# Patient Record
Sex: Male | Born: 1975
Health system: Southern US, Community
[De-identification: ages and names within clinical notes are randomized; demographics above are authoritative.]

## PROBLEM LIST (undated history)

## (undated) DIAGNOSIS — T7840XA Allergy, unspecified, initial encounter: Secondary | ICD-10-CM

## (undated) HISTORY — DX: Allergy, unspecified, initial encounter: T78.40XA

---

## 2002-11-23 HISTORY — PX: ANTERIOR CRUCIATE LIGAMENT REPAIR: SHX115

## 2007-06-15 ENCOUNTER — Ambulatory Visit (HOSPITAL_COMMUNITY): Admission: RE | Admit: 2007-06-15 | Discharge: 2007-06-15 | Payer: Self-pay | Admitting: Pulmonary Disease

## 2013-01-16 ENCOUNTER — Other Ambulatory Visit: Payer: Self-pay | Admitting: Emergency Medicine

## 2013-01-16 NOTE — Telephone Encounter (Signed)
Please pull paper chart. Patient not seen in epic.  

## 2013-01-17 NOTE — Telephone Encounter (Signed)
Chart pulled at pa pool [id number]

## 2013-01-25 ENCOUNTER — Other Ambulatory Visit: Payer: Self-pay | Admitting: Emergency Medicine

## 2013-01-25 NOTE — Telephone Encounter (Signed)
PT SAID PHARMACY SENT OVER A REQUEST FOR A REFILL ON HIS INHALER A WEEK AGO.  THEY HAVE NOT HEARD FROM [place] [phone]

## 2013-01-31 NOTE — Telephone Encounter (Signed)
Pt will make appt to be seen.  He has not been seen since 03/2011

## 2013-02-10 ENCOUNTER — Ambulatory Visit (INDEPENDENT_AMBULATORY_CARE_PROVIDER_SITE_OTHER): Payer: 59 | Admitting: Emergency Medicine

## 2013-02-10 VITALS — BP 138/96 | HR 86 | Temp 98.0°F | Resp 16 | Ht 74.0 in | Wt 238.0 lb

## 2013-02-10 DIAGNOSIS — J309 Allergic rhinitis, unspecified: Secondary | ICD-10-CM

## 2013-02-10 DIAGNOSIS — Z0000 Encounter for general adult medical examination without abnormal findings: Secondary | ICD-10-CM

## 2013-02-10 LAB — LIPID PANEL
Cholesterol: 174 mg/dL (ref 0–200)
HDL: 34 mg/dL — ABNORMAL LOW (ref >39)
Total CHOL/HDL Ratio: 5.1 Ratio
Triglycerides: 158 mg/dL — ABNORMAL HIGH (ref <150)
VLDL: 32 mg/dL (ref 0–40)

## 2013-02-10 LAB — POCT UA - MICROSCOPIC ONLY
Crystals, Ur, HPF, POC: NEGATIVE
Epithelial cells, urine per micros: NEGATIVE
Mucus, UA: NEGATIVE
Yeast, UA: NEGATIVE

## 2013-02-10 LAB — POCT CBC
HCT, POC: 48.4 % (ref 43.5–53.7)
Lymph, poc: 2.4 (ref 0.6–3.4)
MCHC: 32 g/dL (ref 31.8–35.4)
POC Granulocyte: 3.5 (ref 2–6.9)
POC LYMPH PERCENT: 37.7 %L (ref 10–50)
RDW, POC: 15 %

## 2013-02-10 LAB — POCT URINALYSIS DIPSTICK
Blood, UA: NEGATIVE
Ketones, UA: NEGATIVE
Leukocytes, UA: NEGATIVE
Spec Grav, UA: >=1.03
pH, UA: 5.5

## 2013-02-10 LAB — COMPREHENSIVE METABOLIC PANEL
BUN: 16 mg/dL (ref 6–23)
CO2: 31 mEq/L (ref 19–32)
Creat: 0.9 mg/dL (ref 0.50–1.35)
Glucose, Bld: 87 mg/dL (ref 70–99)
Total Bilirubin: 0.8 mg/dL (ref 0.3–1.2)

## 2013-02-10 MED ORDER — MOMETASONE FUROATE 50 MCG/ACT NA SUSP: 4.0000 | Freq: Every day | NASAL | Status: DC

## 2013-02-10 NOTE — Progress Notes (Signed)
[facility] [address] [place] [address] [phone]  Date:  02/10/2013   Name:  Lee Gross   DOB:  Dec 27, 1975   MRN:  [id number]  PCP:  No primary provider on file.    Chief Complaint: Medication Refill   History of Present Illness:  Lee Gross is a 37 y.o. very pleasant male patient who presents with the following:  For complete annual physical. History of seasonal allergic rhinitis.  Works as Company secretary.   Current immunizations.  No chronic medications.  Denies other complaint or health concern today.  Nonsmoker    There is no problem list on file for this patient.   No past medical history on file.  No past surgical history on file.  History  Substance Use Topics  . Smoking status: Never Smoker   . Smokeless tobacco: Not on file  . Alcohol Use: No    No family history on file.  No Known Allergies  Medication list has been reviewed and updated.  No current outpatient prescriptions on file prior to visit.   No current facility-administered medications on file prior to visit.    Review of Systems:  As per HPI, otherwise negative.    Physical Examination: Filed Vitals:   02/10/13 1108  BP: 138/96  Pulse: 86  Temp: 98 F (36.7 C)  Resp: 16   Filed Vitals:   02/10/13 1108  Height: 6\' 2"  (1.88 m)  Weight: 238 lb (107.956 kg)   Body mass index is 30.54 kg/(m^2). Ideal Body Weight: Weight in (lb) to have BMI = 25: 194.3  GEN: WDWN, NAD, Non-toxic, A & O x 3 HEENT: Atraumatic, Normocephalic. Neck supple. No masses, No LAD. Ears and Nose: No external deformity. CV: RRR, No M/G/R. No JVD. No thrill. No extra heart sounds. PULM: CTA B, no wheezes, crackles, rhonchi. No retractions. No resp. distress. No accessory muscle use. ABD: S, NT, ND, +BS. No rebound. No HSM. EXTR: No c/c/e NEURO Normal gait.  PSYCH: Normally interactive. Conversant. Not depressed or anxious appearing.  Calm demeanor.  RECTAL:  NIBMH at  age 5  Assessment and Plan: Wellness exam LABS Follow up based on labs   Signed,  Phillips Odor, MD

## 2013-02-10 NOTE — Patient Instructions (Addendum)
Allergic Rhinitis  Allergic rhinitis is when the mucous membranes in the nose respond to allergens. Allergens are particles in the air that cause your body to have an allergic reaction. This causes you to release allergic antibodies. Through a chain of events, these eventually cause you to release histamine into the blood stream (hence the use of antihistamines). Although meant to be protective to the body, it is this release that causes your discomfort, such as frequent sneezing, congestion and an itchy runny nose.    CAUSES    The pollen allergens may come from grasses, trees, and weeds. This is seasonal allergic rhinitis, or "hay fever." Other allergens cause year-round allergic rhinitis (perennial allergic rhinitis) such as house dust mite allergen, pet dander and mold spores.    SYMPTOMS     Nasal stuffiness (congestion).   Runny, itchy nose with sneezing and tearing of the eyes.   There is often an itching of the mouth, eyes and ears.  It cannot be cured, but it can be controlled with medications.  DIAGNOSIS    If you are unable to determine the offending allergen, skin or blood testing may find it.  TREATMENT     Avoid the allergen.   Medications and allergy shots (immunotherapy) can help.   Hay fever may often be treated with antihistamines in pill or nasal spray forms. Antihistamines block the effects of histamine. There are over-the-counter medicines that may help with nasal congestion and swelling around the eyes. Check with your caregiver before taking or giving this medicine.  If the treatment above does not work, there are many new medications your caregiver can prescribe. Stronger medications may be used if initial measures are ineffective. Desensitizing injections can be used if medications and avoidance fails. Desensitization is when a patient is given ongoing shots until the body becomes less sensitive to the allergen. Make sure you follow up with your caregiver if problems continue.   SEEK MEDICAL CARE IF:     You develop fever (more than 100.5 F (38.1 C).   You develop a cough that does not stop easily (persistent).   You have shortness of breath.   You start wheezing.   Symptoms interfere with normal daily activities.  Document Released: 08/04/2001 Document Revised: 02/01/2012 Document Reviewed: 02/13/2009  Exit[employer] Patient Information 2013 [employer].

## 2013-02-16 ENCOUNTER — Other Ambulatory Visit: Payer: Self-pay

## 2013-02-16 ENCOUNTER — Other Ambulatory Visit: Payer: Self-pay | Admitting: Emergency Medicine

## 2013-02-16 NOTE — Telephone Encounter (Signed)
Pt needs another allergy prescription due to last one given too expensive. Please call into [facility] and  Let pt know when done. [phone]

## 2013-02-17 MED ORDER — FLUTICASONE PROPIONATE 50 MCG/ACT NA SUSP: 2.0000 | Freq: Every day | NASAL | Status: DC

## 2013-02-17 NOTE — Progress Notes (Signed)
Reviewed and agree.

## 2013-02-17 NOTE — Telephone Encounter (Signed)
Nasonex expensive, wants something cheaper. Pended generic flonase please advise

## 2014-08-25 ENCOUNTER — Ambulatory Visit (INDEPENDENT_AMBULATORY_CARE_PROVIDER_SITE_OTHER): Payer: 59 | Admitting: Emergency Medicine

## 2014-08-25 VITALS — BP 131/86 | HR 54 | Temp 97.8°F | Resp 20 | Ht 74.4 in | Wt 231.0 lb

## 2014-08-25 DIAGNOSIS — Z131 Encounter for screening for diabetes mellitus: Secondary | ICD-10-CM

## 2014-08-25 DIAGNOSIS — K429 Umbilical hernia without obstruction or gangrene: Secondary | ICD-10-CM

## 2014-08-25 DIAGNOSIS — R109 Unspecified abdominal pain: Secondary | ICD-10-CM

## 2014-08-25 DIAGNOSIS — Z13220 Encounter for screening for lipoid disorders: Secondary | ICD-10-CM

## 2014-08-25 DIAGNOSIS — M722 Plantar fascial fibromatosis: Secondary | ICD-10-CM

## 2014-08-25 DIAGNOSIS — Z0000 Encounter for general adult medical examination without abnormal findings: Secondary | ICD-10-CM

## 2014-08-25 LAB — GLUCOSE, POCT (MANUAL RESULT ENTRY): POC GLUCOSE: 84 mg/dL (ref 70–99)

## 2014-08-25 LAB — COMPREHENSIVE METABOLIC PANEL
ALT: 16 U/L (ref 0–53)
AST: 23 U/L (ref 0–37)
Albumin: 4.6 g/dL (ref 3.5–5.2)
Alkaline Phosphatase: 106 U/L (ref 39–117)
BUN: 14 mg/dL (ref 6–23)
CALCIUM: 9.5 mg/dL (ref 8.4–10.5)
CHLORIDE: 101 meq/L (ref 96–112)
CO2: 30 meq/L (ref 19–32)
CREATININE: 0.9 mg/dL (ref 0.50–1.35)
Glucose, Bld: 94 mg/dL (ref 70–99)
Potassium: 4.3 mEq/L (ref 3.5–5.3)
Sodium: 140 mEq/L (ref 135–145)
Total Bilirubin: 0.5 mg/dL (ref 0.2–1.2)
Total Protein: 7.7 g/dL (ref 6.0–8.3)

## 2014-08-25 LAB — POCT CBC
Granulocyte percent: 50.4 %G (ref 37–80)
HEMATOCRIT: 45.6 % (ref 43.5–53.7)
Hemoglobin: 14.8 g/dL (ref 14.1–18.1)
LYMPH, POC: 3.2 (ref 0.6–3.4)
MCH, POC: 26.6 pg — AB (ref 27–31.2)
MCHC: 32.5 g/dL (ref 31.8–35.4)
MCV: 81.8 fL (ref 80–97)
MID (CBC): 0.6 (ref 0–0.9)
MPV: 8.7 fL (ref 0–99.8)
PLATELET COUNT, POC: 202 10*3/uL (ref 142–424)
POC GRANULOCYTE: 3.8 (ref 2–6.9)
POC LYMPH %: 41.9 % (ref 10–50)
POC MID %: 7.7 %M (ref 0–12)
RBC: 5.57 M/uL (ref 4.69–6.13)
RDW, POC: 14.4 %
WBC: 7.6 10*3/uL (ref 4.6–10.2)

## 2014-08-25 LAB — POCT URINALYSIS DIPSTICK
BILIRUBIN UA: NEGATIVE
Glucose, UA: NEGATIVE
KETONES UA: NEGATIVE
Leukocytes, UA: NEGATIVE
NITRITE UA: NEGATIVE
PH UA: 5.5
Protein, UA: NEGATIVE
RBC UA: NEGATIVE
Spec Grav, UA: 1.02
Urobilinogen, UA: 0.2

## 2014-08-25 LAB — LIPID PANEL
CHOL/HDL RATIO: 4.7 ratio
CHOLESTEROL: 179 mg/dL (ref 0–200)
HDL: 38 mg/dL — AB (ref >39)
LDL Cholesterol: 102 mg/dL — ABNORMAL HIGH (ref 0–99)
Triglycerides: 194 mg/dL — ABNORMAL HIGH (ref <150)
VLDL: 39 mg/dL (ref 0–40)

## 2014-08-25 NOTE — Progress Notes (Addendum)
This chart was scribed for Lesle Chris, MD by Luisa Dago, ED Scribe. This patient was seen in room 13 and the patient's care was started at 12:03 PM.  Subjective:    Patient ID: Lee Gross, male    DOB: [birth year], 38 y.o.   MRN: [id number]  Chief Complaint  Patient presents with  . Annual Exam    HPI Lee Gross is a 38 y.o. Male with a hx of ACL repair.  Pt comes into the office for his annual physical exam. Pt states that he started exercising a few weeks ago and he started to experience right sided abdominal pain, but he thinks that the pain is muscular. Lee Gross reports a family history of DM. Pt is married with two children.   Second complaint of plantar fascitis to his right leg. He states that he had the same symptoms to his left leg which he received an injection for. Pt states that he has been applying warm and cold compresses to the area, with minimal relief.   Lee Gross is also concerned that he may have an umbilical hernia. He endorses associated soreness, but he states that he is able to tolerate the pain because its not constant but waxes and wanes.   There are no active problems to display for this patient.  No past medical history on file. No past surgical history on file. No Known Allergies Prior to Admission medications   Medication Sig Start Date End Date Taking? Authorizing Provider  fluticasone (FLONASE) 50 MCG/ACT nasal spray Place 2 sprays into the nose daily. 02/17/13  Yes Sondra Barges, PA-C   History   Social History  . Marital Status: Married    Spouse Name: N/A    Number of Children: N/A  . Years of Education: N/A   Occupational History  . Not on file.   Social History Main Topics  . Smoking status: Never Smoker   . Smokeless tobacco: Never Used  . Alcohol Use: No  . Drug Use: No  . Sexual Activity: Yes    Partners: Female   Other Topics Concern  . Not on file   Social History Narrative  . No narrative on file     Review of  Systems  Constitutional: Negative for appetite change and fatigue.  HENT: Negative for congestion, ear discharge and sinus pressure.   Eyes: Negative for discharge.  Respiratory: Negative for cough.   Cardiovascular: Negative for chest pain.  Gastrointestinal: Positive for abdominal pain. Negative for diarrhea.  Genitourinary: Negative for frequency and hematuria.  Musculoskeletal: Negative for back pain.  Skin: Negative for rash.  Neurological: Negative for seizures and headaches.  Psychiatric/Behavioral: Negative for hallucinations.       Objective:   Physical Exam  Nursing note and vitals reviewed. Constitutional: He appears well-developed and well-nourished. No distress.  HENT:  Head: Normocephalic and atraumatic.  Eyes: Conjunctivae are normal. Right eye exhibits no discharge. Left eye exhibits no discharge.  Neck: Neck supple.  Cardiovascular: Normal rate, regular rhythm and normal heart sounds.  Exam reveals no gallop and no friction rub.   No murmur heard. Pulmonary/Chest: Effort normal and breath sounds normal. No respiratory distress.  Abdominal: Soft. He exhibits no distension. There is tenderness in the periumbilical area. A hernia is present.  1.0 x 1.0 cm reducible umbilical hernia.  Musculoskeletal: He exhibits no edema and no tenderness.  Neurological: He is alert.  Skin: Skin is warm and dry.  Psychiatric: He has a  normal mood and affect. His behavior is normal. Thought content normal.     Filed Vitals:   08/25/14 1149  BP: 131/86  Pulse: 54  Temp: 97.8 F (36.6 C)  TempSrc: Oral  Resp: 20  Height: 6' 2.4" (1.89 m)  Weight: 231 lb (104.781 kg)  SpO2: 100%   Results for orders placed in visit on 08/25/14  POCT CBC      Result Value Ref Range   WBC 7.6  4.6 - 10.2 K/uL   Lymph, poc 3.2  0.6 - 3.4   POC LYMPH PERCENT 41.9  10 - 50 %L   MID (cbc) 0.6  0 - 0.9   POC MID % 7.7  0 - 12 %M   POC Granulocyte 3.8  2 - 6.9   Granulocyte percent 50.4  37 -  80 %G   RBC 5.57  4.69 - 6.13 M/uL   Hemoglobin 14.8  14.1 - 18.1 g/dL   HCT, POC 45.445.6  09.843.5 - 53.7 %   MCV 81.8  80 - 97 fL   MCH, POC 26.6 (*) 27 - 31.2 pg   MCHC 32.5  31.8 - 35.4 g/dL   RDW, POC 11.914.4     Platelet Count, POC 202  142 - 424 K/uL   MPV 8.7  0 - 99.8 fL  GLUCOSE, POCT (MANUAL RESULT ENTRY)      Result Value Ref Range   POC Glucose 84  70 - 99 mg/dl  POCT URINALYSIS DIPSTICK      Result Value Ref Range   Color, UA yellow     Clarity, UA clear     Glucose, UA neg     Bilirubin, UA neg     Ketones, UA neg     Spec Grav, UA 1.020     Blood, UA neg     pH, UA 5.5     Protein, UA neg     Urobilinogen, UA 0.2     Nitrite, UA neg     Leukocytes, UA Negative          Assessment & Plan:  Patient declined to have a flu shot. He will call if he wants surgical evaluation of his umbilical hernia he was encouraged to continue to exercise and try and lose weight. I suspect his abdominal discomfort is musculoskeletal with the presence of no fever no change in appetite white count , and urine. Cmet pending  I personally performed the services described in this documentation, which was scribed in my presence. The recorded information has been reviewed and is accurate.

## 2015-05-16 ENCOUNTER — Ambulatory Visit (INDEPENDENT_AMBULATORY_CARE_PROVIDER_SITE_OTHER): Payer: 59 | Admitting: Physician Assistant

## 2015-05-16 VITALS — BP 128/80 | HR 54 | Temp 97.8°F | Resp 16 | Ht 74.0 in | Wt 224.2 lb

## 2015-05-16 DIAGNOSIS — Z130 Encounter for screening for diseases of the blood and blood-forming organs and certain disorders involving the immune mechanism: Secondary | ICD-10-CM

## 2015-05-16 DIAGNOSIS — Z1389 Encounter for screening for other disorder: Secondary | ICD-10-CM

## 2015-05-16 DIAGNOSIS — Z8619 Personal history of other infectious and parasitic diseases: Secondary | ICD-10-CM

## 2015-05-16 DIAGNOSIS — Z0000 Encounter for general adult medical examination without abnormal findings: Secondary | ICD-10-CM | POA: Diagnosis not present

## 2015-05-16 DIAGNOSIS — E781 Pure hyperglyceridemia: Secondary | ICD-10-CM | POA: Diagnosis not present

## 2015-05-16 LAB — POCT URINALYSIS DIPSTICK
BILIRUBIN UA: NEGATIVE
Blood, UA: NEGATIVE
Glucose, UA: NEGATIVE
KETONES UA: NEGATIVE
LEUKOCYTES UA: NEGATIVE
Nitrite, UA: NEGATIVE
PH UA: 5.5
PROTEIN UA: NEGATIVE
Urobilinogen, UA: 0.2

## 2015-05-16 LAB — COMPREHENSIVE METABOLIC PANEL
ALBUMIN: 4.6 g/dL (ref 3.5–5.2)
ALT: 12 U/L (ref 0–53)
AST: 18 U/L (ref 0–37)
Alkaline Phosphatase: 101 U/L (ref 39–117)
BUN: 12 mg/dL (ref 6–23)
CALCIUM: 9.3 mg/dL (ref 8.4–10.5)
CHLORIDE: 102 meq/L (ref 96–112)
CO2: 27 meq/L (ref 19–32)
Creat: 0.93 mg/dL (ref 0.50–1.35)
GLUCOSE: 88 mg/dL (ref 70–99)
POTASSIUM: 4.3 meq/L (ref 3.5–5.3)
SODIUM: 140 meq/L (ref 135–145)
TOTAL PROTEIN: 7.7 g/dL (ref 6.0–8.3)
Total Bilirubin: 0.9 mg/dL (ref 0.2–1.2)

## 2015-05-16 LAB — POCT UA - MICROSCOPIC ONLY
Casts, Ur, LPF, POC: NEGATIVE
Crystals, Ur, HPF, POC: NEGATIVE
EPITHELIAL CELLS, URINE PER MICROSCOPY: NEGATIVE
MUCUS UA: POSITIVE
RBC, URINE, MICROSCOPIC: NEGATIVE
Yeast, UA: NEGATIVE

## 2015-05-16 LAB — LIPID PANEL
CHOLESTEROL: 200 mg/dL (ref 0–200)
HDL: 35 mg/dL — ABNORMAL LOW (ref >=40)
LDL Cholesterol: 131 mg/dL — ABNORMAL HIGH (ref 0–99)
TRIGLYCERIDES: 170 mg/dL — AB (ref <150)
Total CHOL/HDL Ratio: 5.7 Ratio
VLDL: 34 mg/dL (ref 0–40)

## 2015-05-16 LAB — CBC
HCT: 43.7 % (ref 39.0–52.0)
HEMOGLOBIN: 14.9 g/dL (ref 13.0–17.0)
MCH: 27.2 pg (ref 26.0–34.0)
MCHC: 34.1 g/dL (ref 30.0–36.0)
MCV: 79.9 fL (ref 78.0–100.0)
MPV: 11.2 fL (ref 8.6–12.4)
PLATELETS: 211 10*3/uL (ref 150–400)
RBC: 5.47 MIL/uL (ref 4.22–5.81)
RDW: 14.9 % (ref 11.5–15.5)
WBC: 5.6 10*3/uL (ref 4.0–10.5)

## 2015-05-16 LAB — POCT SKIN KOH: SKIN KOH, POC: NEGATIVE

## 2015-05-16 NOTE — Patient Instructions (Addendum)
Your exam was normal today. We are checking labs and urine today and will be in touch with you with those results.  Your scraping from the head was normal. This was not fungal.   Health Maintenance A healthy lifestyle and preventative care can promote health and wellness.  Maintain regular health, dental, and eye exams.  Eat a healthy diet. Foods like vegetables, fruits, whole grains, low-fat dairy products, and lean protein foods contain the nutrients you need and are low in calories. Decrease your intake of foods high in solid fats, added sugars, and salt. Get information about a proper diet from your health care provider, if necessary.  Regular physical exercise is one of the most important things you can do for your health. Most adults should get at least 150 minutes of moderate-intensity exercise (any activity that increases your heart rate and causes you to sweat) each week. In addition, most adults need muscle-strengthening exercises on 2 or more days a week.   Maintain a healthy weight. The body mass index (BMI) is a screening tool to identify possible weight problems. It provides an estimate of body fat based on height and weight. Your health care provider can find your BMI and can help you achieve or maintain a healthy weight. For males 20 years and older:  A BMI below 18.5 is considered underweight.  A BMI of 18.5 to 24.9 is normal.  A BMI of 25 to 29.9 is considered overweight.  A BMI of 30 and above is considered obese.  Maintain normal blood lipids and cholesterol by exercising and minimizing your intake of saturated fat. Eat a balanced diet with plenty of fruits and vegetables. Blood tests for lipids and cholesterol should begin at age 58 and be repeated every 5 years. If your lipid or cholesterol levels are high, you are over age 21, or you are at high risk for heart disease, you may need your cholesterol levels checked more frequently.Ongoing high lipid and cholesterol  levels should be treated with medicines if diet and exercise are not working.  If you smoke, find out from your health care provider how to quit. If you do not use tobacco, do not start.  Lung cancer screening is recommended for adults aged 55-80 years who are at high risk for developing lung cancer because of a history of smoking. A yearly low-dose CT scan of the lungs is recommended for people who have at least a 30-pack-year history of smoking and are current smokers or have quit within the past 15 years. A pack year of smoking is smoking an average of 1 pack of cigarettes a day for 1 year (for example, a 30-pack-year history of smoking could mean smoking 1 pack a day for 30 years or 2 packs a day for 15 years). Yearly screening should continue until the smoker has stopped smoking for at least 15 years. Yearly screening should be stopped for people who develop a health problem that would prevent them from having lung cancer treatment.  If you choose to drink alcohol, do not have more than 2 drinks per day. One drink is considered to be 12 oz (360 mL) of beer, 5 oz (150 mL) of wine, or 1.5 oz (45 mL) of liquor.  Avoid the use of street drugs. Do not share needles with anyone. Ask for help if you need support or instructions about stopping the use of drugs.  High blood pressure causes heart disease and increases the risk of stroke. Blood pressure should be  checked at least every 1-2 years. Ongoing high blood pressure should be treated with medicines if weight loss and exercise are not effective.  If you are 18-8 years old, ask your health care provider if you should take aspirin to prevent heart disease.  Diabetes screening involves taking a blood sample to check your fasting blood sugar level. This should be done once every 3 years after age 46 if you are at a normal weight and without risk factors for diabetes. Testing should be considered at a younger age or be carried out more frequently if you  are overweight and have at least 1 risk factor for diabetes.  Colorectal cancer can be detected and often prevented. Most routine colorectal cancer screening begins at the age of 9 and continues through age 48. However, your health care provider may recommend screening at an earlier age if you have risk factors for colon cancer. On a yearly basis, your health care provider may provide home test kits to check for hidden blood in the stool. A small camera at the end of a tube may be used to directly examine the colon (sigmoidoscopy or colonoscopy) to detect the earliest forms of colorectal cancer. Talk to your health care provider about this at age 39 when routine screening begins. A direct exam of the colon should be repeated every 5-10 years through age 29, unless early forms of precancerous polyps or small growths are found.  People who are at an increased risk for hepatitis B should be screened for this virus. You are considered at high risk for hepatitis B if:  You were born in a country where hepatitis B occurs often. Talk with your health care provider about which countries are considered high risk.  Your parents were born in a high-risk country and you have not received a shot to protect against hepatitis B (hepatitis B vaccine).  You have HIV or AIDS.  You use needles to inject street drugs.  You live with, or have sex with, someone who has hepatitis B.  You are a man who has sex with other men (MSM).  You get hemodialysis treatment.  You take certain medicines for conditions like cancer, organ transplantation, and autoimmune conditions.  Hepatitis C blood testing is recommended for all people born from [birth year] through 1967 and any individual with known risk factors for hepatitis C.  Healthy men should no longer receive prostate-specific antigen (PSA) blood tests as part of routine cancer screening. Talk to your health care provider about prostate cancer screening.  Testicular cancer  screening is not recommended for adolescents or adult males who have no symptoms. Screening includes self-exam, a health care provider exam, and other screening tests. Consult with your health care provider about any symptoms you have or any concerns you have about testicular cancer.  Practice safe sex. Use condoms and avoid high-risk sexual practices to reduce the spread of sexually transmitted infections (STIs).  You should be screened for STIs, including gonorrhea and chlamydia if:  You are sexually active and are younger than 24 years.  You are older than 24 years, and your health care provider tells you that you are at risk for this type of infection.  Your sexual activity has changed since you were last screened, and you are at an increased risk for chlamydia or gonorrhea. Ask your health care provider if you are at risk.  If you are at risk of being infected with HIV, it is recommended that you take a prescription medicine  daily to prevent HIV infection. This is called pre-exposure prophylaxis (PrEP). You are considered at risk if:  You are a man who has sex with other men (MSM).  You are a heterosexual man who is sexually active with multiple partners.  You take drugs by injection.  You are sexually active with a partner who has HIV.  Talk with your health care provider about whether you are at high risk of being infected with HIV. If you choose to begin PrEP, you should first be tested for HIV. You should then be tested every 3 months for as long as you are taking PrEP.  Use sunscreen. Apply sunscreen liberally and repeatedly throughout the day. You should seek shade when your shadow is shorter than you. Protect yourself by wearing long sleeves, pants, a wide-brimmed hat, and sunglasses year round whenever you are outdoors.  Tell your health care provider of new moles or changes in moles, especially if there is a change in shape or color. Also, tell your health care provider if a  mole is larger than the size of a pencil eraser.  A one-time screening for abdominal aortic aneurysm (AAA) and surgical repair of large AAAs by ultrasound is recommended for men aged 79-75 years who are current or former smokers.  Stay current with your vaccines (immunizations). Document Released: 05/07/2008 Document Revised: 11/14/2013 Document Reviewed: 04/06/2011 [facility] Patient Information 2015 [place], [place]. This information is not intended to replace advice given to you by your health care provider. Make sure you discuss any questions you have with your health care provider.

## 2015-05-16 NOTE — Progress Notes (Signed)
Subjective:    Patient ID: Lee Gross, male    DOB: [birth year], 39 y.o.   MRN: [id number]  Chief Complaint  Patient presents with  . Annual Exam   Patient Active Problem List   Diagnosis Date Noted  . Hypertriglyceridemia 05/16/2015   Prior to Admission medications   Medication Sig Start Date End Date Taking? Authorizing Provider  fluticasone (FLONASE) 50 MCG/ACT nasal spray Place 2 sprays into the nose daily. Patient not taking: Reported on 05/16/2015 02/17/13   Sondra Barges, PA-C   Medications, allergies, past medical history, surgical history, family history, social history and problem list reviewed and updated.  HPI  65 yom presents for cpe.   No issues since he saw [place] last year for cpe. Continues to work full time at [employer] center.  Exercise: 3-4 x week with running and swimming. No cp with exertion.  Diet: None specific. Eats good amount vegetables.  He sees a Education officer, community yearly. States he received tdap 4 yrs ago.   Has 2 small bald spots on left side scalp. Concerned this is fungal. Otherwise denies any issues/complaints.   Had normal lab work last year other than mildly elevated trigs.   Review of Systems Negative per ROS sheet.     Objective:   Physical Exam  Constitutional: He is oriented to person, place, and time. He appears well-developed and well-nourished.  Non-toxic appearance. He does not have a sickly appearance. He does not appear ill. No distress.  BP 128/80 mmHg  Pulse 54  Temp(Src) 97.8 F (36.6 C) (Oral)  Resp 16  Ht 6\' 2"  (1.88 m)  Wt 224 lb 3.2 oz (101.696 kg)  BMI 28.77 kg/m2  SpO2 98%   HENT:  Right Ear: Tympanic membrane normal.  Left Ear: Tympanic membrane normal.  Mouth/Throat: Uvula is midline, oropharynx is clear and moist and mucous membranes are normal.  Two small approx 1 cm bald patches left scalp. No overlying scale or erythema.   Eyes: Conjunctivae and EOM are normal. Pupils are equal, round, and reactive to  light.  Neck: Normal range of motion. Carotid bruit is not present. No thyroid mass and no thyromegaly present.  Cardiovascular: Normal rate, regular rhythm and normal heart sounds.   Pulses:      Posterior tibial pulses are 2+ on the right side, and 2+ on the left side.  Pulmonary/Chest: Effort normal and breath sounds normal.  Abdominal: Soft. Normal appearance and bowel sounds are normal. There is no tenderness.  Neurological: He is alert and oriented to person, place, and time.  Skin: Skin is warm and dry. No rash noted.  Psychiatric: He has a normal mood and affect. His speech is normal and behavior is normal.   Results for orders placed or performed in visit on 05/16/15  POCT urinalysis dipstick  Result Value Ref Range   Color, UA yellow    Clarity, UA clear    Glucose, UA neg    Bilirubin, UA neg    Ketones, UA neg    Spec Grav, UA >=1.030    Blood, UA neg    pH, UA 5.5    Protein, UA neg    Urobilinogen, UA 0.2    Nitrite, UA neg    Leukocytes, UA Negative Negative  POCT UA - Microscopic Only  Result Value Ref Range   WBC, Ur, HPF, POC 0-2    RBC, urine, microscopic neg    Bacteria, U Microscopic trace    Mucus, UA  positive    Epithelial cells, urine per micros neg    Crystals, Ur, HPF, POC neg    Casts, Ur, LPF, POC neg    Yeast, UA neg   POCT Skin KOH  Result Value Ref Range   Skin KOH, POC Negative       Assessment & Plan:   70 yom presents for cpe.   Annual physical exam --normal vitals, exam --rtc one year, sooner with issues  Hypertriglyceridemia - Plan: Lipid panel  Screening for deficiency anemia - Plan: CBC  Screening for nephropathy - Plan: POCT urinalysis dipstick, POCT UA - Microscopic Only, Comprehensive metabolic panel  H/O tinea capitis - Plan: POCT Skin KOH --negative scalp koh scraping --rtc if 2 small bald spots progressing or bothersome  Donnajean Lopes, PA-C Physician Assistant-Certified [facility]  05/16/2015 4:50 PM

## 2015-10-18 ENCOUNTER — Ambulatory Visit (INDEPENDENT_AMBULATORY_CARE_PROVIDER_SITE_OTHER): Payer: 59 | Admitting: Family Medicine

## 2015-10-18 ENCOUNTER — Ambulatory Visit (INDEPENDENT_AMBULATORY_CARE_PROVIDER_SITE_OTHER): Payer: 59

## 2015-10-18 VITALS — BP 124/80 | HR 82 | Temp 99.0°F | Resp 16 | Ht 74.0 in | Wt 235.0 lb

## 2015-10-18 DIAGNOSIS — M79604 Pain in right leg: Secondary | ICD-10-CM

## 2015-10-18 DIAGNOSIS — M25561 Pain in right knee: Secondary | ICD-10-CM | POA: Diagnosis not present

## 2015-10-18 DIAGNOSIS — M79601 Pain in right arm: Secondary | ICD-10-CM | POA: Diagnosis not present

## 2015-10-18 MED ORDER — MELOXICAM 15 MG PO TABS: 15.0000 mg | ORAL_TABLET | Freq: Every day | ORAL | Status: AC

## 2015-10-18 NOTE — Progress Notes (Signed)
Xray read and patient discussed with Ms. Jeffery. Agree with assessment and plan of care per her note.   

## 2015-10-18 NOTE — Progress Notes (Signed)
Patient ID: Lee Gross, male    DOB: February 02, 1976, 39 y.o.   MRN: [id number]  PCP: Lucilla EdinAUB, STEVE A, MD  Subjective:   Chief Complaint  Patient presents with  . Leg Injury    Injured right leg and knee yesterday     HPI Presents for evaluation of RIGHT knee pain after a fall during a soccer game yesterday. He isn't sure what happened or how he fall. He describes pain in the entire thigh and knee. Pain with weight bearing and flexion and extension. Swelling of the thigh and knee, even a little in the calf.  History of ACL tear and repair on the LEFT in 2004.  No hip pain. No lower leg or ankle or foot pain.    Review of Systems  Musculoskeletal: Positive for myalgias, joint swelling, arthralgias and gait problem.  Skin: Negative for wound.       Patient Active Problem List   Diagnosis Date Noted  . Hypertriglyceridemia 05/16/2015     Prior to Admission medications   Not on File     No Known Allergies     Objective:  Physical Exam  Constitutional: He is oriented to person, place, and time. He appears well-developed and well-nourished. He is active and cooperative. No distress.  BP 124/80 mmHg  Pulse 82  Temp(Src) 99 F (37.2 C) (Oral)  Resp 16  Ht 6\' 2"  (1.88 m)  Wt 235 lb (106.595 kg)  BMI 30.16 kg/m2  SpO2 99%   Eyes: Conjunctivae are normal.  Pulmonary/Chest: Effort normal.  Musculoskeletal:       Right hip: Normal.       Right knee: He exhibits decreased range of motion, swelling and effusion. He exhibits no ecchymosis, no deformity, no laceration, no erythema, normal alignment, no bony tenderness, normal meniscus and no MCL laxity. Tenderness (exam is difficult due to pain and swelling) found. Medial joint line tenderness noted. No lateral joint line, no MCL, no LCL and no patellar tendon tenderness noted.       Right ankle: Normal. Achilles tendon normal.       Right upper leg: He exhibits tenderness, swelling (RIGHT thigh is 59 cm in circumference  when measured 20 cm above the upper pole of the patella) and edema. He exhibits no bony tenderness, no deformity and no laceration.       Left upper leg: Normal. Swelling: LEFT thigh is 54 cm in circumference when measured 20 cm above the upper pole of the patella.       Right lower leg: He exhibits swelling (minimal). He exhibits no tenderness and no bony tenderness.       Right foot: Normal.  Neurological: He is alert and oriented to person, place, and time.  Psychiatric: He has a normal mood and affect. His speech is normal and behavior is normal.    RIGHT Knee: [facility] reading (PRIMARY) by  Dr. Neva SeatGreene. Degenerative changes throughout the knee, worst in the medial compartment with cystic changes of the medial tibial plateau.  RIGHT Femur: [facility] reading (PRIMARY) by  Dr. Neva SeatGreene. Normal femur without acute bony deformity. Large effusion noted at the knee.           Assessment & Plan:   1. Knee pain, acute, right 2. Leg pain, anterior, right - DG Knee Complete 4 Views Right; Future - DG FEMUR, MIN 2 VIEWS RIGHT; Future - meloxicam (MOBIC) 15 MG tablet; Take 1 tablet (15 mg total) by mouth daily.  Dispense: 30 tablet;  Refill: 0  Suspect quadriceps injury, possibly tear, and possible resultant medial meniscal injury. Ice. Rest. Compression. Anti-inflammatory. Recheck in 48 hours. Educated on compartment syndrome. Anticipate need for additional imagining (MRI of upper leg +/- knee).  Fernande Bras, PA-C Physician Assistant-Certified [facility]

## 2015-10-18 NOTE — Patient Instructions (Signed)
Acute Compartment Syndrome Compartment syndrome is a painful condition that occurs when swelling and pressure build up in a body space (compartment) of the arms or legs. Groups of muscles, nerves, and blood vessels in the arms and legs are separated into various compartments. Each compartment is surrounded by tough layers of tissue called fascia. In compartment syndrome, pressure builds up within the layers of fascia and begins to push on the structures within that compartment.  In acute compartment syndrome, the pressure builds up suddenly, often as the result of an injury. This is a surgical emergency. When a muscle in the compartment moves, you may feel severe pain. If pressure continues to increase, it can block the flow of blood in the smallest blood vessels (capillaries). Then, the nerves and muscles in the compartment cannot get enough oxygen and nutrients (substances needed for survival). They will start to die within 4-8 hours. That is why the pressure needs to be relieved immediately. Identifying the condition early and treating it quickly can prevent most problems. CAUSES  Various things can lead to compartment syndrome. Possible causes include:   Injury. Some injuries can cause swelling or bleeding in a compartment. This can lead to compartment syndrome. Injuries that may cause this problem include:  Broken bones, especially the long bones of the arms and legs.  Crushing injuries.  Penetrating injuries, such as a knife wound that punctures the skin and tissue underneath.  Badly bruised muscles.  Poisonous bites, such as a snake bite.  Severe burns.  Blocked blood flow. This could result from:  A cast or bandage that is too tight.  A surgical procedure. Blood flow sometimes has to be stopped for a while during a surgery, usually with a tourniquet.  Lying for too long in a position that restricts blood flow. This can happen in people who have nerve damage or if a person is  unconscious for a long time.  Drugs used to build up muscles (anabolic steroids).  Drugs that keep the blood from forming clots (blood thinners). SIGNS AND SYMPTOMS  The most common symptom of compartment syndrome is pain. The pain may:   Get worse when moving or stretching the affected body part.  Be more severe than it should be for an injury.  Come along with a feeling of tingling or burning.  Become worse when the area is pushed or squeezed.  Be unaffected by pain medicine. Other symptoms include:   A feeling of tightness or fullness in the affected area.   A loss of feeling.  Weakness in the area.  Loss of movement.  Skin becoming pale, tight, and shiny over the painful area.  DIAGNOSIS  Your health care provider may suspect the problem based on how you describe the pain. The diagnosis is made by using a special device that measures the pressure in the affected area. Blood tests, X-rays, or an ultrasound exam may be done to help rule out other problems.  TREATMENT  Compartment syndrome is a surgical emergency. It should be treated very quickly.   First-aid treatment is given first. This may include:  Promptly treating an injury.  Loosening or removing any cast, bandage, or external wrap that may be causing pain.  Raising the painful arm or leg to the same level as the heart.  Giving oxygen.  Giving fluid through an IV access tube that is put into a vein in the hand or arm.  Surgery (fasciotomy) is needed to relieve the pressure and help prevent permanent  damage. In this surgery, cuts (incisions) are made through the fascia to relieve the pressure in the compartment.   This information is not intended to replace advice given to you by your health care provider. Make sure you discuss any questions you have with your health care provider.   Document Released: 10/28/2009 Document Revised: 07/12/2013 Document Reviewed: 06/13/2013 Elsevier Interactive Patient  Education [employer]

## 2015-10-20 ENCOUNTER — Ambulatory Visit (INDEPENDENT_AMBULATORY_CARE_PROVIDER_SITE_OTHER): Payer: 59 | Admitting: Emergency Medicine

## 2015-10-20 VITALS — BP 142/64 | HR 84 | Temp 98.8°F | Resp 16

## 2015-10-20 DIAGNOSIS — M79604 Pain in right leg: Secondary | ICD-10-CM | POA: Diagnosis not present

## 2015-10-20 DIAGNOSIS — M79601 Pain in right arm: Secondary | ICD-10-CM | POA: Diagnosis not present

## 2015-10-20 NOTE — Progress Notes (Signed)
   Patient ID: Oneita JollyGamal E Caiazzo, male    DOB: 06/03/1976, 39 y.o.   MRN: [id number]  PCP: Lucilla EdinAUB, STEVE A, MD  Subjective:   Chief Complaint  Patient presents with  . Follow-up    leg pain    HPI Presents for evaluation of RIGHT leg and knee pain after an injury playing soccer 3 days ago.  He was seen here 10/18/2015 with swelling and pain of the anterior thigh and medial knee. Xrays revealed soft tissue swelling, a knee effusion but no bony injuries of the knee or femur. At the time he was thought to have a quadriceps muscle tear and possible secondary medial meniscal injury. He was counseled on symptoms of compartment syndrome, ace wrap applied for compression of the knee and thigh, crutches and meloxicam.  Returns today for re-evaluation reporting considerable improvement, though persistent swelling and pain. He's ambulating using crutches without difficulty.  His wife notes that the calf seems a little more swollen on the RIGHT.    Review of Systems As above. No CP, SOB. No numbness or tingling in the feet.    Patient Active Problem List   Diagnosis Date Noted  . Hypertriglyceridemia 05/16/2015     Prior to Admission medications   Medication Sig Start Date End Date Taking? Authorizing Provider  meloxicam (MOBIC) 15 MG tablet Take 1 tablet (15 mg total) by mouth daily. 10/18/15  Yes Chelle Jeffery, PA-C     No Known Allergies     Objective:  Physical Exam  Constitutional: He is oriented to person, place, and time. He appears well-developed and well-nourished. He is active and cooperative. No distress.  BP 142/64 mmHg  Pulse 84  Temp(Src) 98.8 F (37.1 C)  Resp 16  SpO2 98%   Eyes: Conjunctivae are normal.  Pulmonary/Chest: Effort normal.  Musculoskeletal:       Right knee: He exhibits swelling and effusion. He exhibits normal range of motion, no ecchymosis, no deformity, no LCL laxity, normal patellar mobility, no bony tenderness and no MCL laxity. Tenderness  found. Medial joint line (anteriorly) tenderness noted.       Right ankle: Normal. Achilles tendon normal.       Right upper leg: He exhibits tenderness, swelling and edema. He exhibits no bony tenderness.       Left upper leg: Normal.       Right lower leg: He exhibits edema (mild). He exhibits no tenderness, no bony tenderness and no swelling.       Left lower leg: Normal.       Right foot: Normal.       Left foot: Normal.  Neurological: He is alert and oriented to person, place, and time. He has normal strength. No sensory deficit.  Skin: Skin is warm and dry. No rash noted. No erythema.  Psychiatric: He has a normal mood and affect. His speech is normal and behavior is normal.           Assessment & Plan:   1. Leg pain, anterior, right Likely quadriceps tear, improving. Suspect that knee effusion is dependent and may not represent a separate injury. Continue compression, elevation, meloxicam and crutches. Re-evaluate on 10/25/2015. Modified work.   Fernande Brashelle S. Jeffery, PA-C Physician Assistant-Certified [facility]

## 2015-10-20 NOTE — Progress Notes (Signed)
   Patient ID: Lee Gross, male    DOB: 10/29/1976, 39 y.o.   MRN: [id number]  PCP: DAUB, STEVE A, MD  Subjective:   Chief Complaint  Patient presents with  . Follow-up    leg pain    HPI Presents for evaluation of RIGHT leg and knee pain after an injury playing soccer 3 days ago.  He was seen here 10/18/2015 with swelling and pain of the anterior thigh and medial knee. Xrays revealed soft tissue swelling, a knee effusion but no bony injuries of the knee or femur. At the time he was thought to have a quadriceps muscle tear and possible secondary medial meniscal injury. He was counseled on symptoms of compartment syndrome, ace wrap applied for compression of the knee and thigh, crutches and meloxicam.  Returns today for re-evaluation reporting considerable improvement, though persistent swelling and pain. He's ambulating using crutches without difficulty.  His wife notes that the calf seems a little more swollen on the RIGHT.    Review of Systems As above. No CP, SOB. No numbness or tingling in the feet.    Patient Active Problem List   Diagnosis Date Noted  . Hypertriglyceridemia 05/16/2015     Prior to Admission medications   Medication Sig Start Date End Date Taking? Authorizing Provider  meloxicam (MOBIC) 15 MG tablet Take 1 tablet (15 mg total) by mouth daily. 10/18/15  Yes Cachet Mccutchen, PA-C     No Known Allergies     Objective:  Physical Exam  Constitutional: He is oriented to person, place, and time. He appears well-developed and well-nourished. He is active and cooperative. No distress.  BP 142/64 mmHg  Pulse 84  Temp(Src) 98.8 F (37.1 C)  Resp 16  SpO2 98%   Eyes: Conjunctivae are normal.  Pulmonary/Chest: Effort normal.  Musculoskeletal:       Right knee: He exhibits swelling and effusion. He exhibits normal range of motion, no ecchymosis, no deformity, no LCL laxity, normal patellar mobility, no bony tenderness and no MCL laxity. Tenderness  found. Medial joint line (anteriorly) tenderness noted.       Right ankle: Normal. Achilles tendon normal.       Right upper leg: He exhibits tenderness, swelling and edema. He exhibits no bony tenderness.       Left upper leg: Normal.       Right lower leg: He exhibits edema (mild). He exhibits no tenderness, no bony tenderness and no swelling.       Left lower leg: Normal.       Right foot: Normal.       Left foot: Normal.  Neurological: He is alert and oriented to person, place, and time. He has normal strength. No sensory deficit.  Skin: Skin is warm and dry. No rash noted. No erythema.  Psychiatric: He has a normal mood and affect. His speech is normal and behavior is normal.           Assessment & Plan:   1. Leg pain, anterior, right Likely quadriceps tear, improving. Suspect that knee effusion is dependent and may not represent a separate injury. Continue compression, elevation, meloxicam and crutches. Re-evaluate on 10/25/2015. Modified work.   Apolo Cutshaw S. Leo Fray, PA-C Physician Assistant-Certified [facility]  

## 2015-10-20 NOTE — Patient Instructions (Signed)
Continue to use the crutches and ace wrap. Elevate the leg as much as you can. Take the meloxicam one time daily for pain and inflammation.

## 2015-10-22 ENCOUNTER — Encounter: Payer: Self-pay | Admitting: Family Medicine

## 2015-10-22 ENCOUNTER — Telehealth: Payer: Self-pay

## 2015-10-22 NOTE — Telephone Encounter (Signed)
Totally ok for note to be out until 12/08 and follow-up then, as long as it's not getting worse. If it starts to worsen, he should return sooner.

## 2015-10-22 NOTE — Telephone Encounter (Signed)
Lee Gross, pt stopped by and spoke with Revonda StandardAllison. Says there is no work available if he is on crutches. So Revonda StandardAllison rewrote him a note (you can see it in chart review), pretty much saying that he can return to work with restrictions if available. There was some confusion but it sounds like he wants the note to say out of work until 12/8 and follow up then, instead of on 12/2. We explained to him the reason for the re-eval and that an MRI may need to be ordered. I think he is worried about not being able to work and make money and he is worried about not being able to pay for re-eval here. It sounds like he is wanting to try to qualify for short term disability which he gets if he is out for more than 5 days, and trying to reduce the amount of visits here due to co pay. Allie talked to him when he came in, since this is confusing speak with Gerarda Guntherllie and she can explain in more detail or better.

## 2015-10-23 ENCOUNTER — Telehealth: Payer: Self-pay | Admitting: Emergency Medicine

## 2015-10-23 ENCOUNTER — Other Ambulatory Visit: Payer: Self-pay | Admitting: Emergency Medicine

## 2015-10-23 ENCOUNTER — Telehealth: Payer: Self-pay | Admitting: *Deleted

## 2015-10-23 ENCOUNTER — Encounter: Payer: Self-pay | Admitting: *Deleted

## 2015-10-23 DIAGNOSIS — S7011XD Contusion of right thigh, subsequent encounter: Secondary | ICD-10-CM

## 2015-10-23 DIAGNOSIS — M2391 Unspecified internal derangement of right knee: Secondary | ICD-10-CM

## 2015-10-23 NOTE — Telephone Encounter (Signed)
Please call patient and let him know I placed an order for an MRI of his right femur and knee.

## 2015-10-23 NOTE — Telephone Encounter (Signed)
Pt would like to be set up for MRI for his knee.

## 2015-10-23 NOTE — Telephone Encounter (Signed)
Please call patient and let him know I placed the order for an MRI of his femur and knee.

## 2015-10-25 NOTE — Telephone Encounter (Signed)
Left message Mri ordered.

## 2015-10-25 NOTE — Telephone Encounter (Signed)
Pt.notified

## 2015-10-25 NOTE — Telephone Encounter (Signed)
Pt notified of mri

## 2015-11-06 ENCOUNTER — Telehealth: Payer: Self-pay | Admitting: Emergency Medicine

## 2015-11-06 NOTE — Telephone Encounter (Signed)
Patient is requesting a return to work note related to his disability forms. I will call this patient in the AM.  [phone]

## 2015-11-07 NOTE — Telephone Encounter (Signed)
Looks like note was written

## 2015-11-08 ENCOUNTER — Ambulatory Visit (HOSPITAL_COMMUNITY): Payer: 59

## 2015-12-10 ENCOUNTER — Ambulatory Visit (HOSPITAL_COMMUNITY): Payer: 59

## 2016-01-23 ENCOUNTER — Ambulatory Visit (HOSPITAL_COMMUNITY): Payer: 59

## 2016-01-23 ENCOUNTER — Ambulatory Visit (HOSPITAL_COMMUNITY): Admission: RE | Admit: 2016-01-23 | Payer: 59 | Source: Ambulatory Visit

## 2016-03-05 ENCOUNTER — Ambulatory Visit (HOSPITAL_COMMUNITY): Payer: 59

## 2016-04-16 ENCOUNTER — Ambulatory Visit (HOSPITAL_COMMUNITY): Payer: 59

## 2016-11-05 ENCOUNTER — Ambulatory Visit (HOSPITAL_COMMUNITY): Payer: 59

## 2016-11-05 ENCOUNTER — Other Ambulatory Visit (HOSPITAL_COMMUNITY): Payer: 59

## 2017-06-10 IMAGING — CR DG FEMUR 2+V*R*
4 series · 4 of 4 positions shown · non-contrast
Comparison: None.

CLINICAL DATA: Acute right thigh pain after fall at soccer game
yesterday.

EXAM:
RIGHT FEMUR 2 VIEWS

[AP (1 of 2)]
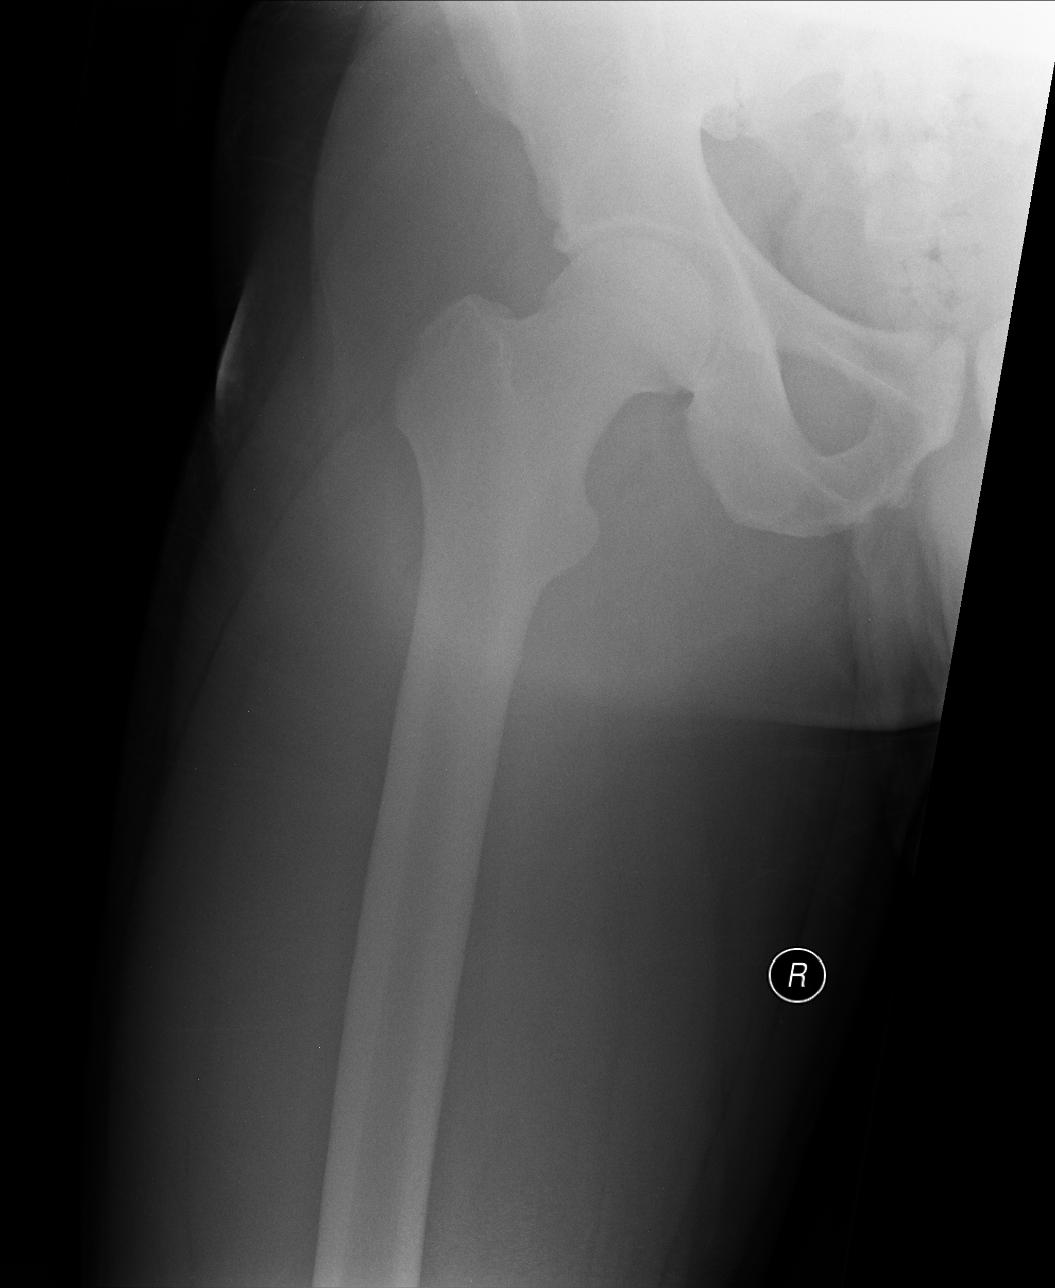

[lateral (1 of 2)]
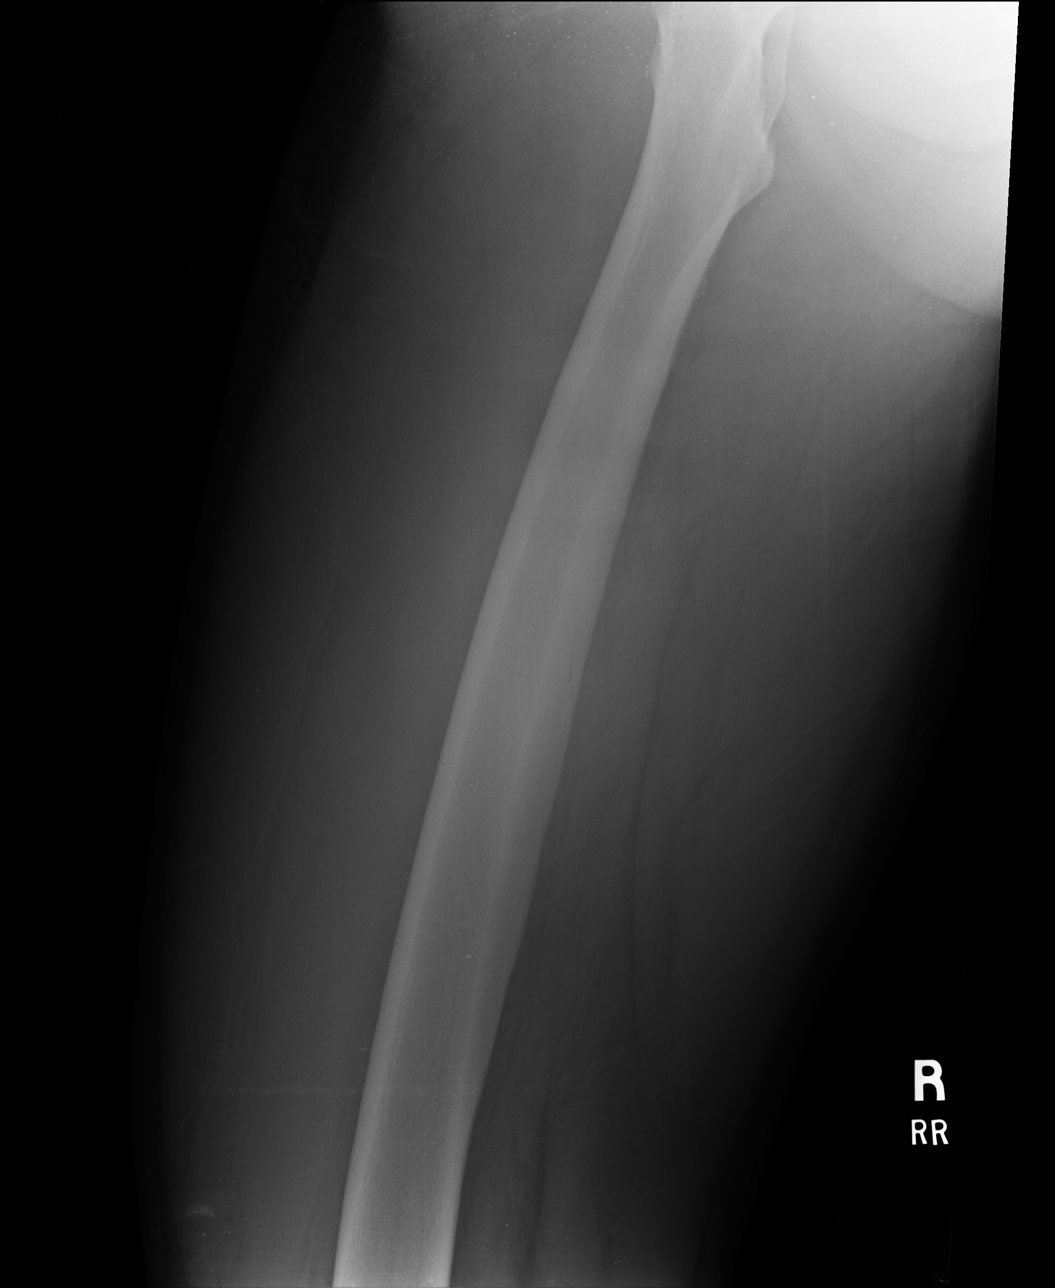

[AP (2 of 2)]
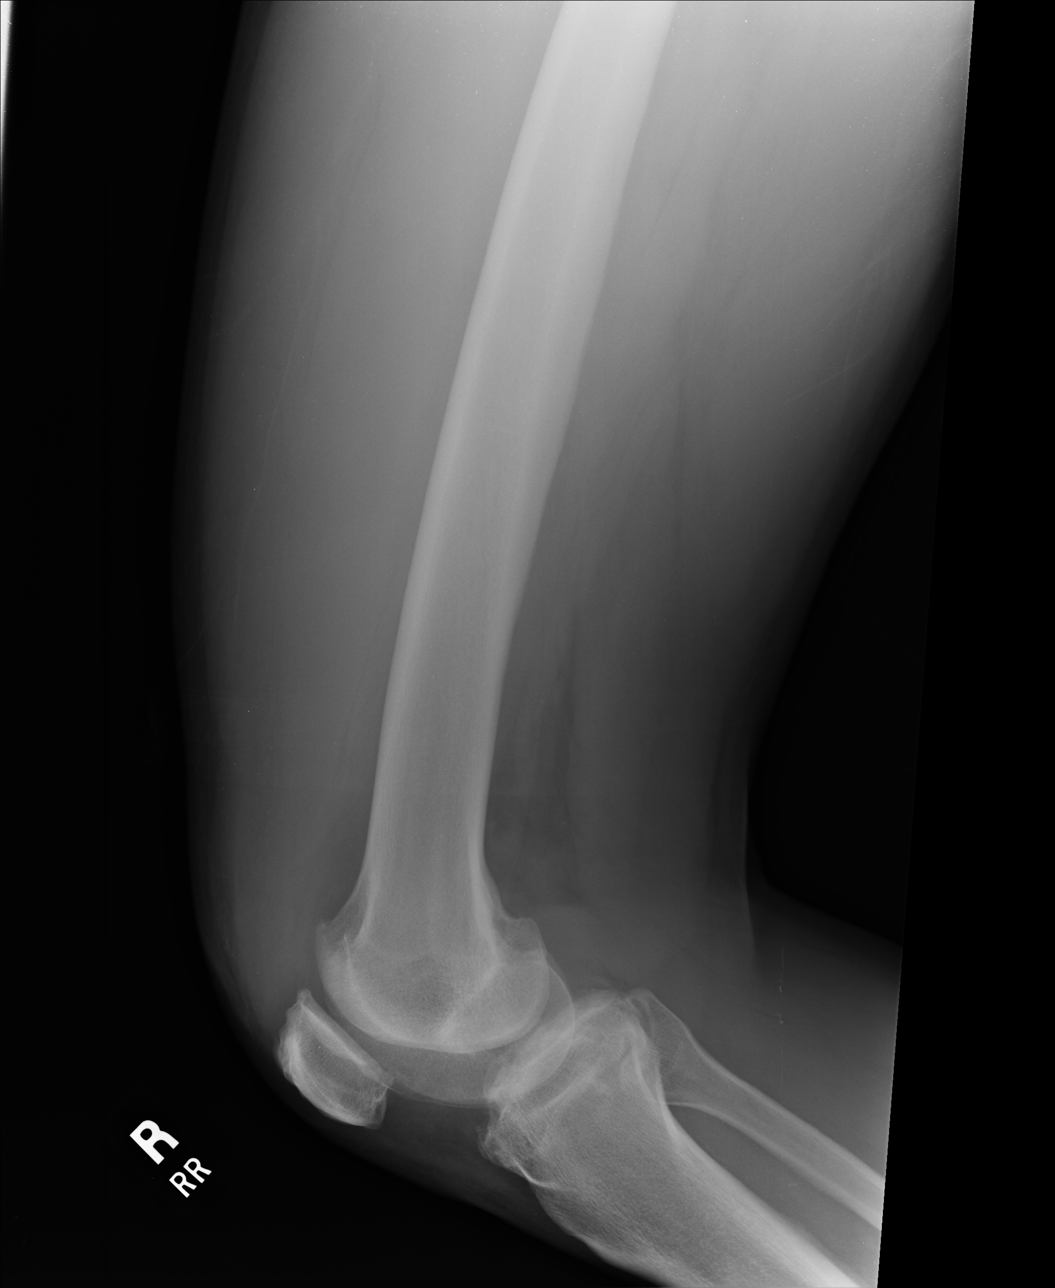

[lateral (2 of 2)]
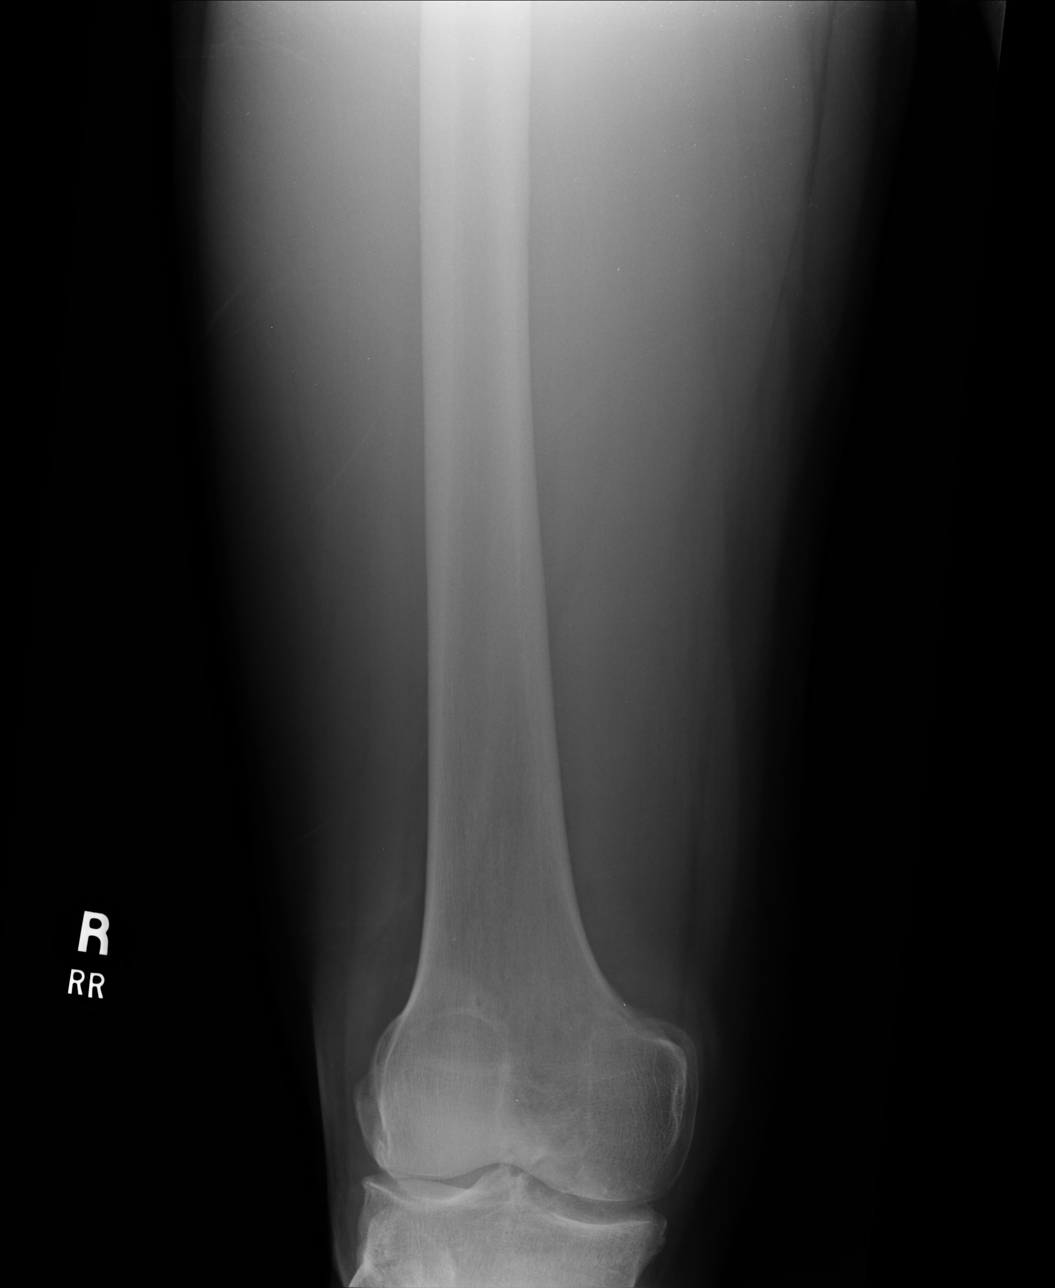

[4 of 4 positions shown; findings below may reference images not displayed]

FINDINGS: There is no evidence of fracture or other focal bone lesions. Soft
tissues are unremarkable.
IMPRESSION: Normal right femur.

## 2018-10-05 ENCOUNTER — Ambulatory Visit (INDEPENDENT_AMBULATORY_CARE_PROVIDER_SITE_OTHER): Payer: 59 | Admitting: Emergency Medicine

## 2018-10-05 ENCOUNTER — Encounter: Payer: Self-pay | Admitting: Emergency Medicine

## 2018-10-05 ENCOUNTER — Other Ambulatory Visit: Payer: Self-pay

## 2018-10-05 VITALS — BP 145/82 | HR 59 | Temp 98.3°F | Resp 16 | Ht 74.0 in | Wt 248.0 lb

## 2018-10-05 DIAGNOSIS — Z7689 Persons encountering health services in other specified circumstances: Secondary | ICD-10-CM

## 2018-10-05 DIAGNOSIS — Z130 Encounter for screening for diseases of the blood and blood-forming organs and certain disorders involving the immune mechanism: Secondary | ICD-10-CM

## 2018-10-05 DIAGNOSIS — Z0000 Encounter for general adult medical examination without abnormal findings: Secondary | ICD-10-CM | POA: Diagnosis not present

## 2018-10-05 DIAGNOSIS — Z1329 Encounter for screening for other suspected endocrine disorder: Secondary | ICD-10-CM | POA: Diagnosis not present

## 2018-10-05 DIAGNOSIS — Z13220 Encounter for screening for lipoid disorders: Secondary | ICD-10-CM | POA: Diagnosis not present

## 2018-10-05 DIAGNOSIS — Z13228 Encounter for screening for other metabolic disorders: Secondary | ICD-10-CM

## 2018-10-05 DIAGNOSIS — R0683 Snoring: Secondary | ICD-10-CM

## 2018-10-05 LAB — COMPREHENSIVE METABOLIC PANEL
A/G RATIO: 1.5 (ref 1.2–2.2)
ALT: 18 IU/L (ref 0–44)
AST: 21 IU/L (ref 0–40)
Albumin: 4.3 g/dL (ref 3.5–5.5)
Alkaline Phosphatase: 99 IU/L (ref 39–117)
BILIRUBIN TOTAL: 0.4 mg/dL (ref 0.0–1.2)
BUN/Creatinine Ratio: 11 (ref 9–20)
BUN: 11 mg/dL (ref 6–24)
CALCIUM: 9.6 mg/dL (ref 8.7–10.2)
CO2: 25 mmol/L (ref 20–29)
Chloride: 101 mmol/L (ref 96–106)
Creatinine, Ser: 0.96 mg/dL (ref 0.76–1.27)
GFR, EST AFRICAN AMERICAN: 111 mL/min/{1.73_m2} (ref >59)
GFR, EST NON AFRICAN AMERICAN: 96 mL/min/{1.73_m2} (ref >59)
GLOBULIN, TOTAL: 2.9 g/dL (ref 1.5–4.5)
GLUCOSE: 89 mg/dL (ref 65–99)
POTASSIUM: 4.3 mmol/L (ref 3.5–5.2)
SODIUM: 140 mmol/L (ref 134–144)
Total Protein: 7.2 g/dL (ref 6.0–8.5)

## 2018-10-05 LAB — CBC WITH DIFFERENTIAL/PLATELET
BASOS ABS: 0 10*3/uL (ref 0.0–0.2)
Basos: 0 %
EOS (ABSOLUTE): 0.1 10*3/uL (ref 0.0–0.4)
Eos: 1 %
Hematocrit: 46.9 % (ref 37.5–51.0)
Hemoglobin: 15.5 g/dL (ref 13.0–17.7)
IMMATURE GRANS (ABS): 0 10*3/uL (ref 0.0–0.1)
IMMATURE GRANULOCYTES: 1 %
LYMPHS: 38 %
Lymphocytes Absolute: 2.6 10*3/uL (ref 0.7–3.1)
MCH: 27.3 pg (ref 26.6–33.0)
MCHC: 33 g/dL (ref 31.5–35.7)
MCV: 83 fL (ref 79–97)
MONOS ABS: 0.6 10*3/uL (ref 0.1–0.9)
Monocytes: 9 %
Neutrophils Absolute: 3.6 10*3/uL (ref 1.4–7.0)
Neutrophils: 51 %
PLATELETS: 250 10*3/uL (ref 150–450)
RBC: 5.67 x10E6/uL (ref 4.14–5.80)
RDW: 14.6 % (ref 12.3–15.4)
WBC: 6.9 10*3/uL (ref 3.4–10.8)

## 2018-10-05 LAB — LIPID PANEL
CHOL/HDL RATIO: 5.5 ratio — AB (ref 0.0–5.0)
Cholesterol, Total: 198 mg/dL (ref 100–199)
HDL: 36 mg/dL — ABNORMAL LOW (ref >39)
LDL Calculated: 120 mg/dL — ABNORMAL HIGH (ref 0–99)
Triglycerides: 210 mg/dL — ABNORMAL HIGH (ref 0–149)
VLDL Cholesterol Cal: 42 mg/dL — ABNORMAL HIGH (ref 5–40)

## 2018-10-05 LAB — HEMOGLOBIN A1C
Est. average glucose Bld gHb Est-mCnc: 120 mg/dL
HEMOGLOBIN A1C: 5.8 % — AB (ref 4.8–5.6)

## 2018-10-05 NOTE — Progress Notes (Signed)
Lee Gross 42 y.o.   Chief Complaint  Patient presents with  . Annual Exam    HISTORY OF PRESENT ILLNESS: This is a 42 y.o. male here for annual exam.  No chronic medical problems.  No chronic medications.  Non-smoker.  Non-EtOH use.  Healthy lifestyle.  Exercises regularly.  Good nutrition.  Adequate sleep.  However wife thinks he may have sleep apnea.  Works regular hours.  No occupational exposure.  Family history of diabetes.  Has no complaints or medical concerns today.  HPI   Prior to Admission medications   Medication Sig Start Date End Date Taking? Authorizing Provider  meloxicam (MOBIC) 15 MG tablet Take 1 tablet (15 mg total) by mouth daily. Patient not taking: Reported on 10/05/2018 10/18/15   Porfirio Oar, PA    No Known Allergies  Patient Active Problem List   Diagnosis Date Noted  . Hypertriglyceridemia 05/16/2015    No past medical history on file.  Past Surgical History:  Procedure Laterality Date  . ANTERIOR CRUCIATE LIGAMENT REPAIR Left 2004    Social History   Socioeconomic History  . Marital status: Married    Spouse name: Lee Gross  . Number of children: 2  . Years of education: Not on file  . Highest education level: Not on file  Occupational History  . Occupation: Production designer, theatre/television/film  Social Needs  . Financial resource strain: Not on file  . Food insecurity:    Worry: Not on file    Inability: Not on file  . Transportation needs:    Medical: Not on file    Non-medical: Not on file  Tobacco Use  . Smoking status: Never Smoker  . Smokeless tobacco: Never Used  Substance and Sexual Activity  . Alcohol use: No  . Drug use: No  . Sexual activity: Yes    Partners: Female  Lifestyle  . Physical activity:    Days per week: Not on file    Minutes per session: Not on file  . Stress: Not on file  Relationships  . Social connections:    Talks on phone: Not on file    Gets together: Not on file    Attends religious service: Not on file   Active member of club or organization: Not on file    Attends meetings of clubs or organizations: Not on file    Relationship status: Not on file  . Intimate partner violence:    Fear of current or ex partner: Not on file    Emotionally abused: Not on file    Physically abused: Not on file    Forced sexual activity: Not on file  Other Topics Concern  . Not on file  Social History Narrative   From [place].   Lives with his wife and their 2 sons.   His wife trained as a physician in [place], but they left before she started practice. She has completed USMLE steps 1 and 2, and hopes to become a Sports administrator.   Plays soccer.    Family History  Problem Relation Age of Onset  . Diabetes Mother   . Diabetes Father   . Cancer Sister 94       rhabomyosarcoma  . Diabetes Maternal Grandmother   . Diabetes Maternal Grandfather      Review of Systems  Constitutional: Negative.  Negative for chills and fever.  HENT: Negative.  Negative for congestion, hearing loss, nosebleeds and sore throat.   Eyes: Negative.  Negative for blurred vision and double  vision.  Respiratory: Negative.  Negative for cough, hemoptysis and shortness of breath.   Cardiovascular: Negative.  Negative for chest pain and palpitations.  Gastrointestinal: Negative.  Negative for abdominal pain, blood in stool, diarrhea, melena, nausea and vomiting.  Genitourinary: Negative.  Negative for dysuria and hematuria.  Musculoskeletal: Negative.  Negative for back pain, myalgias and neck pain.  Skin: Negative.  Negative for rash.  Neurological: Negative.  Negative for dizziness, sensory change, focal weakness and headaches.  Endo/Heme/Allergies: Negative.   All other systems reviewed and are negative.  Vitals:   10/05/18 1100  BP: (!) 145/82  Pulse: (!) 59  Resp: 16  Temp: 98.3 F (36.8 C)  SpO2: 97%     Physical Exam  Constitutional: He is oriented to person, place, and time. He appears well-developed and  well-nourished.  HENT:  Head: Normocephalic and atraumatic.  Right Ear: External ear normal.  Left Ear: External ear normal.  Nose: Nose normal.  Mouth/Throat: Oropharynx is clear and moist.  Eyes: Pupils are equal, round, and reactive to light. Conjunctivae and EOM are normal.  Neck: Normal range of motion. Neck supple. No JVD present. No thyromegaly present.  Cardiovascular: Normal rate, regular rhythm and normal heart sounds.  Pulmonary/Chest: Effort normal and breath sounds normal.  Abdominal: Soft. Bowel sounds are normal. He exhibits no distension. There is no tenderness.  Musculoskeletal: Normal range of motion. He exhibits no edema or tenderness.  Lymphadenopathy:    He has no cervical adenopathy.  Neurological: He is alert and oriented to person, place, and time. No sensory deficit. He exhibits normal muscle tone. Coordination normal.  Skin: Skin is warm and dry. Capillary refill takes less than 2 seconds.  Psychiatric: He has a normal mood and affect. His behavior is normal.  Vitals reviewed.    ASSESSMENT & PLAN: Lee Gross was seen today for annual exam.  Diagnoses and all orders for this visit:  Routine general medical examination at a health care facility  Screening for lipoid disorders -     Lipid panel  Screening for endocrine, metabolic and immunity disorder -     CBC with Differential -     Comprehensive metabolic panel -     Lipid panel -     Hemoglobin A1c  Snoring -     Ambulatory referral to Pulmonology  Sleep concern -     Ambulatory referral to Pulmonology     Patient Instructions       If you have lab work done today you will be contacted with your lab results within the next 2 weeks.  If you have not heard from [place] then please contact us. The fastest way to get your results is to register for My Chart.   IF you received an x-ray today, you will receive an invoice from [facility]. Please contact [facility] at [phone]  with questions or concerns regarding your invoice.   IF you received labwork today, you will receive an invoice from [facility]. Please contact [facility] at [phone] with questions or concerns regarding your invoice.   Our billing staff will not be able to assist you with questions regarding bills from these companies.  You will be contacted with the lab results as soon as they are available. The fastest way to get your results is to activate your My Chart account. Instructions are located on the last page of this paperwork. If you have not heard from [place] regarding the results in 2 weeks, please contact this office.  Health Maintenance, Male A healthy lifestyle and preventive care is important for your health and wellness. Ask your health care provider about what schedule of regular examinations is right for you. What should I know about weight and diet? Eat a Healthy Diet  Eat plenty of vegetables, fruits, whole grains, low-fat dairy products, and lean protein.  Do not eat a lot of foods high in solid fats, added sugars, or salt.  Maintain a Healthy Weight Regular exercise can help you achieve or maintain a healthy weight. You should:  Do at least 150 minutes of exercise each week. The exercise should increase your heart rate and make you sweat (moderate-intensity exercise).  Do strength-training exercises at least twice a week.  Watch Your Levels of Cholesterol and Blood Lipids  Have your blood tested for lipids and cholesterol every 5 years starting at 44 years of age. If you are at high risk for heart disease, you should start having your blood tested when you are 44 years old. You may need to have your cholesterol levels checked more often if: ? Your lipid or cholesterol levels are high. ? You are older than 44 years of age. ? You are at high risk for heart disease.  What should I know about cancer screening? Many types of cancers can be detected early and may often be  prevented. Lung Cancer  You should be screened every year for lung cancer if: ? You are a current smoker who has smoked for at least 30 years. ? You are a former smoker who has quit within the past 15 years.  Talk to your health care provider about your screening options, when you should start screening, and how often you should be screened.  Colorectal Cancer  Routine colorectal cancer screening usually begins at 44 years of age and should be repeated every 5-10 years until you are 44 years old. You may need to be screened more often if early forms of precancerous polyps or small growths are found. Your health care provider may recommend screening at an earlier age if you have risk factors for colon cancer.  Your health care provider may recommend using home test kits to check for hidden blood in the stool.  A small camera at the end of a tube can be used to examine your colon (sigmoidoscopy or colonoscopy). This checks for the earliest forms of colorectal cancer.  Prostate and Testicular Cancer  Depending on your age and overall health, your health care provider may do certain tests to screen for prostate and testicular cancer.  Talk to your health care provider about any symptoms or concerns you have about testicular or prostate cancer.  Skin Cancer  Check your skin from head to toe regularly.  Tell your health care provider about any new moles or changes in moles, especially if: ? There is a change in a mole's size, shape, or color. ? You have a mole that is larger than a pencil eraser.  Always use sunscreen. Apply sunscreen liberally and repeat throughout the day.  Protect yourself by wearing long sleeves, pants, a wide-brimmed hat, and sunglasses when outside.  What should I know about heart disease, diabetes, and high blood pressure?  If you are 40-22 years of age, have your blood pressure checked every 3-5 years. If you are 51 years of age or older, have your blood  pressure checked every year. You should have your blood pressure measured twice-once when you are at a hospital or clinic, and once  when you are not at a hospital or clinic. Record the average of the two measurements. To check your blood pressure when you are not at a hospital or clinic, you can use: ? An automated blood pressure machine at a pharmacy. ? A home blood pressure monitor.  Talk to your health care provider about your target blood pressure.  If you are between 6645-44 years old, ask your health care provider if you should take aspirin to prevent heart disease.  Have regular diabetes screenings by checking your fasting blood sugar level. ? If you are at a normal weight and have a low risk for diabetes, have this test once every three years after the age of 44. ? If you are overweight and have a high risk for diabetes, consider being tested at a younger age or more often.  A one-time screening for abdominal aortic aneurysm (AAA) by ultrasound is recommended for men aged 65-75 years who are current or former smokers. What should I know about preventing infection? Hepatitis B If you have a higher risk for hepatitis B, you should be screened for this virus. Talk with your health care provider to find out if you are at risk for hepatitis B infection. Hepatitis C Blood testing is recommended for:  Everyone born from [birth year] through 1967.  Anyone with known risk factors for hepatitis C.  Sexually Transmitted Diseases (STDs)  You should be screened each year for STDs including gonorrhea and chlamydia if: ? You are sexually active and are younger than 44 years of age. ? You are older than 44 years of age and your health care provider tells you that you are at risk for this type of infection. ? Your sexual activity has changed since you were last screened and you are at an increased risk for chlamydia or gonorrhea. Ask your health care provider if you are at risk.  Talk with your health  care provider about whether you are at high risk of being infected with HIV. Your health care provider may recommend a prescription medicine to help prevent HIV infection.  What else can I do?  Schedule regular health, dental, and eye exams.  Stay current with your vaccines (immunizations).  Do not use any tobacco products, such as cigarettes, chewing tobacco, and e-cigarettes. If you need help quitting, ask your health care provider.  Limit alcohol intake to no more than 2 drinks per day. One drink equals 12 ounces of beer, 5 ounces of wine, or 1 ounces of hard liquor.  Do not use street drugs.  Do not share needles.  Ask your health care provider for help if you need support or information about quitting drugs.  Tell your health care provider if you often feel depressed.  Tell your health care provider if you have ever been abused or do not feel safe at home. This information is not intended to replace advice given to you by your health care provider. Make sure you discuss any questions you have with your health care provider. Document Released: 05/07/2008 Document Revised: 07/08/2016 Document Reviewed: 08/13/2015 Elsevier Interactive Patient Education  2018 [employer]      Edwina BarthMiguel Johnathan Heskett, MD [facility]

## 2018-10-05 NOTE — Patient Instructions (Addendum)

## 2018-10-06 ENCOUNTER — Other Ambulatory Visit: Payer: Self-pay | Admitting: Emergency Medicine

## 2018-10-06 MED ORDER — ROSUVASTATIN CALCIUM 10 MG PO TABS: 10.0000 mg | ORAL_TABLET | Freq: Every day | ORAL | 3 refills | Status: AC

## 2019-08-03 ENCOUNTER — Other Ambulatory Visit: Payer: Self-pay

## 2019-08-03 DIAGNOSIS — Z20822 Contact with and (suspected) exposure to covid-19: Secondary | ICD-10-CM

## 2019-08-04 LAB — NOVEL CORONAVIRUS, NAA: SARS-CoV-2, NAA: NOT DETECTED

## 2019-09-26 ENCOUNTER — Other Ambulatory Visit: Payer: Self-pay

## 2019-09-26 DIAGNOSIS — Z20822 Contact with and (suspected) exposure to covid-19: Secondary | ICD-10-CM

## 2019-09-28 LAB — NOVEL CORONAVIRUS, NAA: SARS-CoV-2, NAA: NOT DETECTED

## 2019-10-13 ENCOUNTER — Other Ambulatory Visit: Payer: Self-pay

## 2019-10-13 DIAGNOSIS — Z20822 Contact with and (suspected) exposure to covid-19: Secondary | ICD-10-CM

## 2019-10-16 LAB — NOVEL CORONAVIRUS, NAA: SARS-CoV-2, NAA: NOT DETECTED
